# Patient Record
Sex: Female | Born: 1955 | Hispanic: No | Marital: Single | Smoking: Current every day smoker
Health system: Southern US, Community
[De-identification: ages and names within clinical notes are randomized; demographics above are authoritative.]

## PROBLEM LIST (undated history)

## (undated) ENCOUNTER — Emergency Department (HOSPITAL_COMMUNITY): Discharge status: Absent without leave | Payer: Medicaid Other

## (undated) DIAGNOSIS — F32A Depression, unspecified: Secondary | ICD-10-CM

## (undated) DIAGNOSIS — F329 Major depressive disorder, single episode, unspecified: Secondary | ICD-10-CM

## (undated) HISTORY — PX: TUBAL LIGATION: SHX77

## (undated) HISTORY — PX: ABDOMINAL HYSTERECTOMY: SHX81

---

## 2014-02-20 ENCOUNTER — Ambulatory Visit: Payer: Self-pay | Admitting: Physician Assistant

## 2014-09-30 ENCOUNTER — Emergency Department
Admit: 2014-09-30 | Disposition: A | Discharge status: Home / Self Care | Payer: Self-pay | Admitting: Emergency Medicine

## 2014-09-30 LAB — BASIC METABOLIC PANEL
Anion Gap: 9 (ref 7–16)
BUN: 29 mg/dL — ABNORMAL HIGH
Calcium, Total: 9.6 mg/dL
Chloride: 108 mmol/L
Co2: 23 mmol/L
Creatinine: 0.74 mg/dL
GLUCOSE: 107 mg/dL — AB
Potassium: 4.1 mmol/L
Sodium: 140 mmol/L

## 2014-09-30 LAB — CBC WITH DIFFERENTIAL/PLATELET
BASOS ABS: 0.1 10*3/uL (ref 0.0–0.1)
Basophil %: 1.1 %
EOS ABS: 0.3 10*3/uL (ref 0.0–0.7)
Eosinophil %: 3.2 %
HCT: 46.3 % (ref 35.0–47.0)
HGB: 15.8 g/dL (ref 12.0–16.0)
Lymphocyte #: 2.8 10*3/uL (ref 1.0–3.6)
Lymphocyte %: 30.7 %
MCH: 30.2 pg (ref 26.0–34.0)
MCHC: 34 g/dL (ref 32.0–36.0)
MCV: 89 fL (ref 80–100)
Monocyte #: 0.5 x10 3/mm (ref 0.2–0.9)
Monocyte %: 5.3 %
NEUTROS ABS: 5.4 10*3/uL (ref 1.4–6.5)
Neutrophil %: 59.7 %
Platelet: 317 10*3/uL (ref 150–440)
RBC: 5.21 10*6/uL — ABNORMAL HIGH (ref 3.80–5.20)
RDW: 13 % (ref 11.5–14.5)
WBC: 9 10*3/uL (ref 3.6–11.0)

## 2014-09-30 LAB — URINALYSIS, COMPLETE
Bilirubin,UR: NEGATIVE
Blood: NEGATIVE
Glucose,UR: NEGATIVE mg/dL (ref 0–75)
KETONE: NEGATIVE
Leukocyte Esterase: NEGATIVE
Nitrite: NEGATIVE
PH: 5 (ref 4.5–8.0)
PROTEIN: NEGATIVE
Specific Gravity: 1.01 (ref 1.003–1.030)

## 2014-09-30 LAB — TROPONIN I

## 2014-09-30 LAB — SALICYLATE LEVEL: Salicylates, Serum: <4 mg/dL

## 2014-09-30 LAB — CK: CK, Total: 153 U/L

## 2014-12-19 ENCOUNTER — Emergency Department: Payer: Medicaid Other

## 2014-12-19 ENCOUNTER — Encounter: Payer: Self-pay | Admitting: Emergency Medicine

## 2014-12-19 ENCOUNTER — Emergency Department
Admission: EM | Admit: 2014-12-19 | Discharge: 2014-12-19 | Disposition: A | Discharge status: Home / Self Care | Payer: Medicaid Other | Attending: Student | Admitting: Student

## 2014-12-19 DIAGNOSIS — Z720 Tobacco use: Secondary | ICD-10-CM | POA: Diagnosis not present

## 2014-12-19 DIAGNOSIS — R51 Headache: Secondary | ICD-10-CM | POA: Insufficient documentation

## 2014-12-19 DIAGNOSIS — G8929 Other chronic pain: Secondary | ICD-10-CM | POA: Diagnosis not present

## 2014-12-19 LAB — BASIC METABOLIC PANEL
ANION GAP: 10 (ref 5–15)
BUN: 23 mg/dL — ABNORMAL HIGH (ref 6–20)
CHLORIDE: 106 mmol/L (ref 101–111)
CO2: 22 mmol/L (ref 22–32)
Calcium: 8.7 mg/dL — ABNORMAL LOW (ref 8.9–10.3)
Creatinine, Ser: 0.65 mg/dL (ref 0.44–1.00)
GFR calc non Af Amer: >60 mL/min (ref >60)
GLUCOSE: 123 mg/dL — AB (ref 65–99)
POTASSIUM: 3.7 mmol/L (ref 3.5–5.1)
Sodium: 138 mmol/L (ref 135–145)

## 2014-12-19 LAB — CBC WITH DIFFERENTIAL/PLATELET
Basophils Absolute: 0.1 10*3/uL (ref 0–0.1)
Basophils Relative: 1 %
Eosinophils Absolute: 0.2 10*3/uL (ref 0–0.7)
Eosinophils Relative: 2 %
HCT: 44.1 % (ref 35.0–47.0)
Hemoglobin: 14.9 g/dL (ref 12.0–16.0)
LYMPHS ABS: 2.5 10*3/uL (ref 1.0–3.6)
LYMPHS PCT: 27 %
MCH: 30 pg (ref 26.0–34.0)
MCHC: 33.8 g/dL (ref 32.0–36.0)
MCV: 88.8 fL (ref 80.0–100.0)
MONO ABS: 0.5 10*3/uL (ref 0.2–0.9)
Monocytes Relative: 6 %
NEUTROS ABS: 5.9 10*3/uL (ref 1.4–6.5)
Neutrophils Relative %: 64 %
Platelets: 274 10*3/uL (ref 150–440)
RBC: 4.97 MIL/uL (ref 3.80–5.20)
RDW: 12.9 % (ref 11.5–14.5)
WBC: 9.3 10*3/uL (ref 3.6–11.0)

## 2014-12-19 MED ORDER — GADOBENATE DIMEGLUMINE 529 MG/ML IV SOLN
20.0000 mL | Freq: Once | INTRAVENOUS | Status: AC | PRN
  Administered 2014-12-19: 19 mL via INTRAVENOUS

## 2014-12-19 MED ORDER — KETOROLAC TROMETHAMINE 30 MG/ML IJ SOLN
INTRAMUSCULAR | Status: AC
  Administered 2014-12-19: 30 mg via INTRAVENOUS
  Filled 2014-12-19: qty 1

## 2014-12-19 MED ORDER — KETOROLAC TROMETHAMINE 30 MG/ML IJ SOLN
30.0000 mg | Freq: Once | INTRAMUSCULAR | Status: AC
  Administered 2014-12-19: 30 mg via INTRAVENOUS

## 2014-12-19 MED ORDER — DIPHENHYDRAMINE HCL 50 MG/ML IJ SOLN
INTRAMUSCULAR | Status: AC
  Administered 2014-12-19: 12.5 mg via INTRAVENOUS
  Filled 2014-12-19: qty 1

## 2014-12-19 MED ORDER — DIPHENHYDRAMINE HCL 50 MG/ML IJ SOLN
12.5000 mg | Freq: Once | INTRAMUSCULAR | Status: AC
  Administered 2014-12-19: 12.5 mg via INTRAVENOUS

## 2014-12-19 MED ORDER — SODIUM CHLORIDE 0.9 % IV BOLUS (SEPSIS)
1000.0000 mL | Freq: Once | INTRAVENOUS | Status: AC
  Administered 2014-12-19: 1000 mL via INTRAVENOUS

## 2014-12-19 MED ORDER — METOCLOPRAMIDE HCL 5 MG/ML IJ SOLN
INTRAMUSCULAR | Status: AC
Start: 2014-12-19 — End: 2014-12-19
  Administered 2014-12-19: 10 mg via INTRAVENOUS
  Filled 2014-12-19: qty 2

## 2014-12-19 MED ORDER — METOCLOPRAMIDE HCL 5 MG/ML IJ SOLN
10.0000 mg | Freq: Once | INTRAMUSCULAR | Status: AC
  Administered 2014-12-19: 10 mg via INTRAVENOUS

## 2014-12-19 NOTE — ED Notes (Signed)
Visual acuity 20/30 both eyes

## 2014-12-19 NOTE — ED Notes (Signed)
Patient to MRI.

## 2014-12-19 NOTE — ED Provider Notes (Signed)
[facility] Emergency Department Provider Note  ____________________________________________  Time seen: Approximately 2:09 PM  I have reviewed the triage vital signs and the nursing notes.   HISTORY  Chief Complaint Headache    HPI Manaia Samad is a 59 y.o. female with no chronic medical problems, history of hysterectomy, presents for evaluation of 3 months constant daily pressure-like headache, gradual onset, worse today. She has been seen by her primary care doctor, has had negative CT head however the cause of her headache remains unclear. She reports that intermittently she has "blurred vision". She has no blurred vision currently. She denies any sudden loss of vision. She describes the pain as pressure-like throughout the head and behind the eyes. No photophobia or phonophobia, no fevers, no neck pain or neck stiffness, no nausea or vomiting. No fevers. Currently her pain is moderate to severe. Modifying factors.   History reviewed. No pertinent past medical history.  There are no active problems to display for this patient.   Past Surgical History  Procedure Laterality Date  . Abdominal hysterectomy      No current outpatient prescriptions on file.  Allergies Review of patient's allergies indicates no known allergies.  History reviewed. No pertinent family history.  Social History History  Substance Use Topics  . Smoking status: Current Every Day Smoker -- 1.00 packs/day    Types: Cigarettes  . Smokeless tobacco: Not on file  . Alcohol Use: No    Review of Systems Constitutional: No fever/chills Eyes: No visual changes. ENT: No sore throat. Cardiovascular: Denies chest pain. Respiratory: Denies shortness of breath. Gastrointestinal: No abdominal pain.  No nausea, no vomiting.  No diarrhea.  No constipation. Genitourinary: Negative for dysuria. Musculoskeletal: Negative for back pain. Skin: Negative for rash. Neurological:  Positive for headaches,  No focal weakness or numbness.  10-point ROS otherwise negative.  ____________________________________________   PHYSICAL EXAM:  VITAL SIGNS: ED Triage Vitals  Enc Vitals Group     BP 12/19/14 1339 137/89 mmHg     Pulse Rate 12/19/14 1339 108     Resp --      Temp 12/19/14 1339 98.2 F (36.8 C)     Temp Source 12/19/14 1339 Oral     SpO2 12/19/14 1339 96 %     Weight 12/19/14 1339 200 lb (90.719 kg)     Height 12/19/14 1339  (1.6 m)     Head Cir --      Peak Flow --      Pain Score 12/19/14 1340 10     Pain Loc --      Pain Edu? --      Excl. in GC? --     Constitutional: Alert and oriented. Well appearing and in no acute distress. Eyes: Conjunctivae are normal. PERRL. EOMI. Head: Atraumatic. Nose: No congestion/rhinnorhea. Mouth/Throat: Mucous membranes are moist.  Oropharynx non-erythematous. Neck: No stridor. Neck supple without meningismus. Cardiovascular: Normal rate, regular rhythm. Grossly normal heart sounds.  Good peripheral circulation. Respiratory: Normal respiratory effort.  No retractions. Lungs CTAB. Gastrointestinal: Soft and nontender. No distention. No abdominal bruits. No CVA tenderness. Genitourinary: deferred Musculoskeletal: No lower extremity tenderness nor edema.  No joint effusions. Neurologic:  Normal speech and language. No gross focal neurologic deficits are appreciated. Speech is normal. No gait instability. 5 out of 5 strength in bilateral upper and lower extremities, sensation intact to light touch throughout, cranial nerves II through XII intact, normal finger-nose-finger, normal heel to shin. Skin:  Skin is  warm, dry. Large tick attached to upper midback. Psychiatric: Mood and affect are normal. Speech and behavior are normal.  ____________________________________________   LABS (all labs ordered are listed, but only abnormal results are displayed)  Labs Reviewed  BASIC METABOLIC PANEL - Abnormal; Notable  for the following:    Glucose, Bld 123 (*)    BUN 23 (*)    Calcium 8.7 (*)    All other components within normal limits  CBC WITH DIFFERENTIAL/PLATELET   ____________________________________________  EKG  none ____________________________________________  RADIOLOGY  MRI/MRA brain MRI HEAD FINDINGS  No evidence for acute infarction, hemorrhage, mass lesion, hydrocephalus, or extra-axial fluid. Normal cerebral volume. There are few minor foci of white matter disease, nonspecific, and not unexpected for age. No large vessel or lacunar infarct.  Marked empty sella. There is expansion of the sella with marked flattening of the gland against the floor. Empty sella can be associated with a wide variety of neurologic complaints, which include headache and visual difficulty. Correlate clinically.  Flow voids are maintained throughout the carotid, basilar, and vertebral arteries. There are no areas of chronic hemorrhage. No pineal or cerebellar tonsil abnormality.  Post infusion, no abnormal enhancement of the brain or meninges.  Visualized calvarium, skull base, and upper cervical osseous structures unremarkable. Scalp and extracranial soft tissues, orbits, sinuses, and mastoids show no acute process.  MRA HEAD FINDINGS  The internal carotid arteries demonstrate no proximal stenosis or occlusion. There is no evidence for dissection.  The basilar artery is widely patent. Both vertebrals contribute equally.  No proximal stenosis of the anterior, middle, or posterior cerebral arteries. No intracranial aneurysm. No cerebellar branch occlusion.  Mild irregularity of the distal MCA and PCA branches could suggest intracranial atherosclerotic disease.  IMPRESSION: Marked empty sella. The pituitary is flattened against the floor of an enlarged sella turcica. This can be associated with a wide variety of neurologic complaints, including headache and visual difficulty.  No  acute intracranial findings, or intracranial mass lesion.  No abnormal postcontrast enhancement.  No proximal intracranial stenosis or aneurysm.    ____________________________________________   PROCEDURES  Procedure(s) performed: None  Critical Care performed: No  ____________________________________________   INITIAL IMPRESSION / ASSESSMENT AND PLAN / ED COURSE  Pertinent labs & imaging results that were available during my care of the patient were reviewed by me and considered in my medical decision making (see chart for details).  Florina Schweers Cockcroft is a 59 y.o. female with no chronic medical problems, history of hysterectomy, presents for evaluation of 3 months constant daily pressure-like headache, gradual onset. On exam, she is generally well-appearing and in no acute distress. Mild tachycardia on arrival has resolved without any intervention in the emergency department. She is an intact neurological examination. Neck is supple without meningismus. Doubt subarachnoid hemorrhage or meningitis however given daily pressure-like headache, we will obtain MRI of the brain to evaluate for possible mass/aneurysm. Symptoms are not consistent with CVA or TIAs. We'll treat with migraine cocktail.  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 Discussed with Dr.Nundkumar of [facility] reports patient does not require any acute neurosurgical intervention, she can be referred to neurology as an outpatient. Headache improved somewhat with IV and cocktail. Discussed return precautions, close PCP follow-up, neurology follow-up. Patient is comfortable with the discharge plan. ____________________________________________   FINAL CLINICAL IMPRESSION(S) / ED DIAGNOSES  Final diagnoses:  Chronic nonintractable headache, unspecified headache type      Bobetta Lime A  Inocencio Homes,  MD 12/19/14 [phone]

## 2014-12-19 NOTE — ED Notes (Signed)
MRI paged and made aware of patient. States she will be here in about 20 minutes. Will need someone to transport patient and to sit with tech during exam.

## 2014-12-19 NOTE — ED Notes (Signed)
Pt complains of pressure in head and has been seeing her PCP for symptoms for last 3 months. Pt states she woke up today and the pressure is worse, has trouble seeing and doing her normal ADL's. Pt states that medications that were prescribed to her are not helping.

## 2014-12-19 NOTE — ED Notes (Signed)
Returned with pt from MRI.

## 2014-12-19 NOTE — ED Notes (Signed)
Patient seen here in April for same. Has primary MD. Was told to see neurology and cannot get in until Sept 2016. Patient states she is concerned because she is not getting any better and is scared to drive.

## 2015-01-03 ENCOUNTER — Ambulatory Visit
Admission: EM | Admit: 2015-01-03 | Discharge: 2015-01-03 | Disposition: A | Discharge status: Home / Self Care | Payer: Medicaid Other | Attending: Internal Medicine | Admitting: Internal Medicine

## 2015-01-03 ENCOUNTER — Encounter: Payer: Self-pay | Admitting: Emergency Medicine

## 2015-01-03 DIAGNOSIS — L240 Irritant contact dermatitis due to detergents: Secondary | ICD-10-CM | POA: Diagnosis not present

## 2015-01-03 DIAGNOSIS — L0100 Impetigo, unspecified: Secondary | ICD-10-CM | POA: Diagnosis not present

## 2015-01-03 DIAGNOSIS — L258 Unspecified contact dermatitis due to other agents: Secondary | ICD-10-CM

## 2015-01-03 HISTORY — DX: Major depressive disorder, single episode, unspecified: F32.9

## 2015-01-03 HISTORY — DX: Depression, unspecified: F32.A

## 2015-01-03 MED ORDER — PREDNISONE 20 MG PO TABS: 20.0000 mg | ORAL_TABLET | Freq: Every day | ORAL | Status: AC

## 2015-01-03 MED ORDER — MUPIROCIN 2 % EX OINT: 1.0000 "application " | TOPICAL_OINTMENT | Freq: Two times a day (BID) | CUTANEOUS | Status: AC

## 2015-01-03 NOTE — ED Notes (Signed)
Rash on face , arm and down neck for 2 days

## 2015-01-03 NOTE — Discharge Instructions (Signed)
Prescriptions for prednisone (steroid) and mupirocin ointment (antibiotic) were sent to the pharmacy. Recheck if not starting to improve in 2-3 days.  Contact Dermatitis Contact dermatitis is a rash that happens when something touches the skin. You touched something that irritates your skin, or you have allergies to something you touched. HOME CARE   Avoid the thing that caused your rash.  Keep your rash away from hot water, soap, sunlight, chemicals, and other things that might bother it.  Do not scratch your rash.  You can take cool baths to help stop itching.  Only take medicine as told by your doctor.  Keep all doctor visits as told. GET HELP RIGHT AWAY IF:   Your rash is not better after 3 days.  Your rash gets worse.  Your rash is puffy (swollen), tender, red, sore, or warm.  You have problems with your medicine. MAKE SURE YOU:   Understand these instructions.  Will watch your condition.  Will get help right away if you are not doing well or get worse. Document Released: 04/08/2009 Document Revised: 09/03/2011 Document Reviewed: 11/14/2010 [facility] Patient Information 2015 [place], [place]. This information is not intended to replace advice given to you by your health care provider. Make sure you discuss any questions you have with your health care provider.

## 2015-01-03 NOTE — ED Provider Notes (Signed)
CSN: [id number]     Arrival date & time 01/03/15  1745 History   First MD Initiated Contact with Patient 01/03/15 1912     Chief Complaint  Patient presents with  . Rash   HPI  Patient is a 59 year old lady washed a couple of dogs and some harsh flea soap 2 days ago. Subsequently she has had the development of itchy blistery red patches over her left face and neck, with some on her left forearm as well. She is miserable itching. A few of the patches are sore from scratching. No fever, no malaise.   Past Medical History  Diagnosis Date  . Depression    Past Surgical History  Procedure Laterality Date  . Abdominal hysterectomy    . Tubal ligation     No family history on file. History  Substance Use Topics  . Smoking status: Current Every Day Smoker -- 1.00 packs/day    Types: Cigarettes  . Smokeless tobacco: Never Used  . Alcohol Use: No    Review of Systems  All other systems reviewed and are negative.   Allergies  Review of patient's allergies indicates no known allergies.  Home Medications   Prior to Admission medications   Medication Sig Start Date End Date Taking? Authorizing Provider  escitalopram (LEXAPRO) 10 MG tablet Take 10 mg by mouth daily.   Yes Historical Provider, MD                 BP 120/61 mmHg  Pulse 91  Temp(Src) 98.8 F (37.1 C) (Oral)  Resp 16  Ht 5\' 3"  (1.6 m)  Wt 200 lb (90.719 kg)  BMI 35.44 kg/m2  SpO2 98% Physical Exam  Constitutional: She is oriented to person, place, and time. No distress.  Alert, nicely groomed  HENT:  Head: Atraumatic.  Eyes:  Conjugate gaze, no eye redness/drainage  Neck: Neck supple.  Cardiovascular: Normal rate.   Pulmonary/Chest: No respiratory distress.  Abdominal: She exhibits no distension.  Musculoskeletal: Normal range of motion.  No leg swelling  Neurological: She is alert and oriented to person, place, and time.  Skin: Skin is warm and dry.  No cyanosis Irregular red patches over most of the  left face and neck, extending into the hairline. Many of the patches are papular, and most have some vesicles. There are some similar, smaller patches on the left ventral forearm A few patches have some golden crusting.  Nursing note and vitals reviewed.   ED Course  Procedures   MDM   1. Contact dermatitis due to soap   2. Impetigo    Prescriptions for prednisone and Bactroban ointment were sent to the pharmacy. The patient should recheck if redness and itching are not starting to improve in 2-3 days.    Eustace MooreLaura W Ehab Humber, MD 01/03/15 [phone]

## 2017-06-24 IMAGING — MR MR MRA HEAD W/O CM
1 series · 22 of 48 positions shown · IV contrast (multihance)
Comparison: None.

CLINICAL DATA: Daily headaches, which are worse than usual today,
along with visual difficulty today. Initial encounter.

EXAM:
MRI HEAD WITHOUT AND WITH CONTRAST
MRA HEAD WITHOUT CONTRAST
TECHNIQUE: Multiplanar, multiecho pulse sequences of the brain and surrounding
structures were obtained without and with intravenous contrast.
Angiographic images of the head were obtained using MRA technique
without contrast.
CONTRAST:  19mL MULTIHANCE GADOBENATE DIMEGLUMINE 529 MG/ML IV SOLN

[Series 2: TOF · axial · non-contrast · 0.7mm · 0.37mm/px · z∈[-67,+62]mm · 22 of 195 slices shown]
[im 1/195]
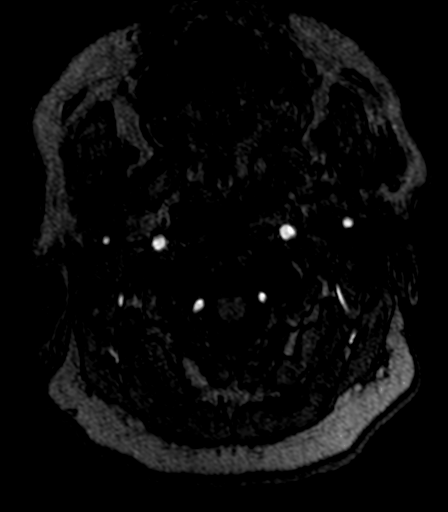
[im 5/195]
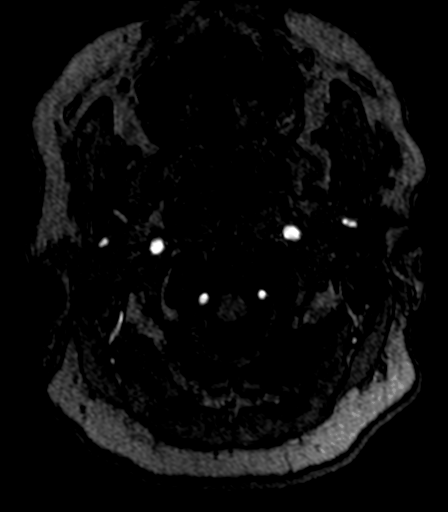
[im 9/195]
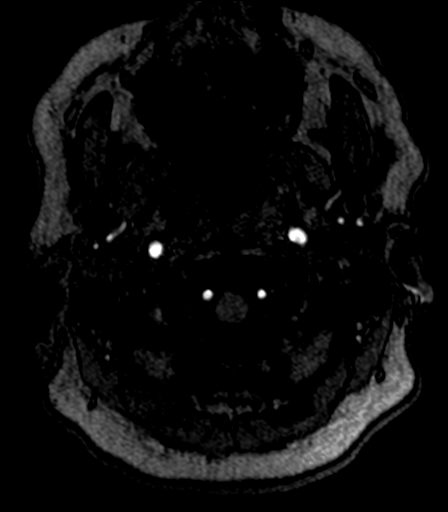
[im 13/195]
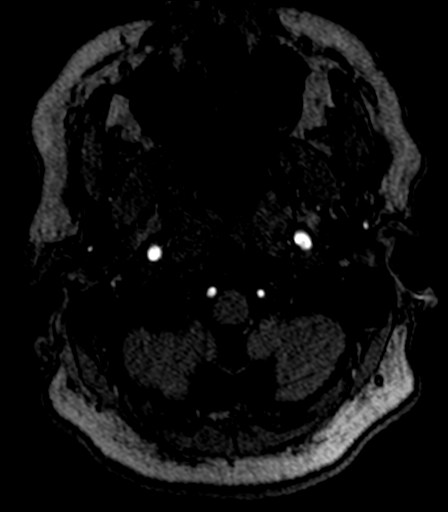
[im 17/195]
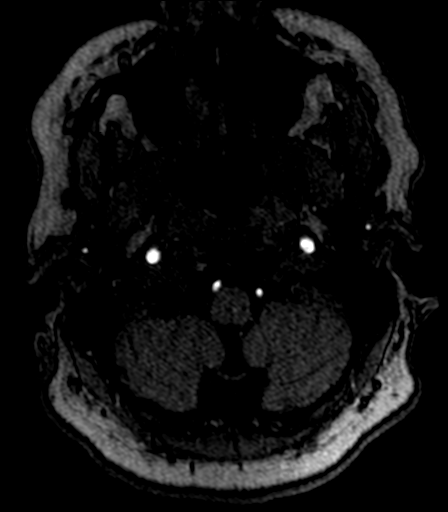
[im 21/195]
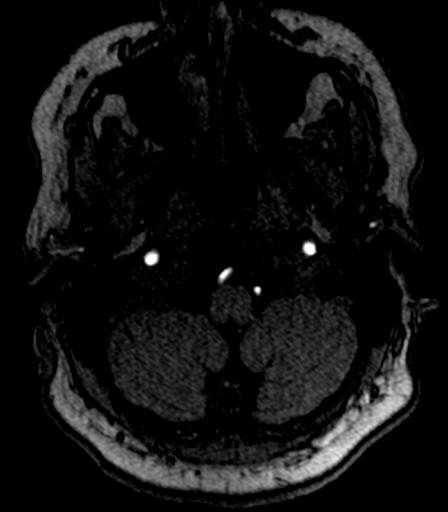
[im 25/195]
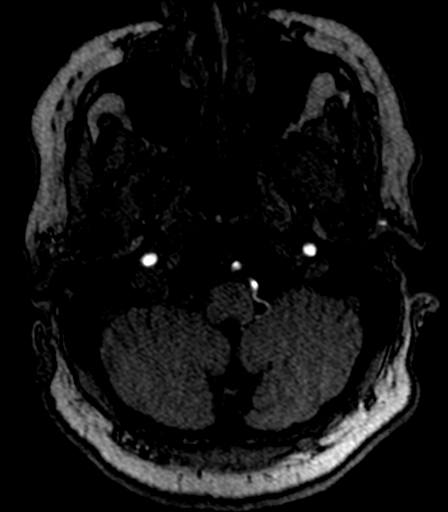
[im 29/195]
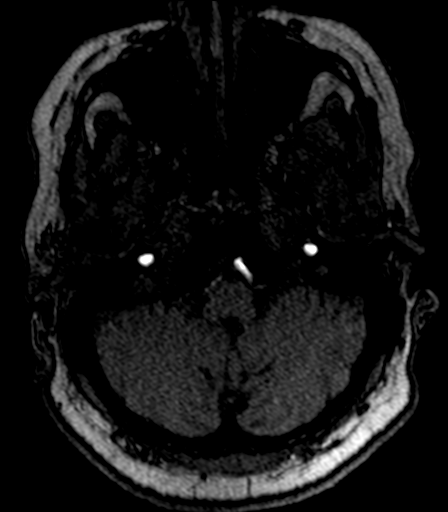
[im 34/195]
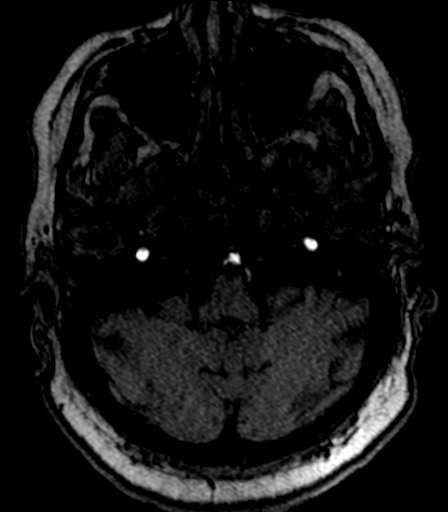
[im 38/195]
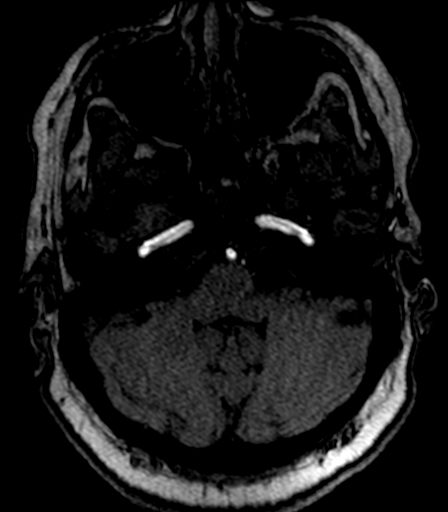
[im 42/195]
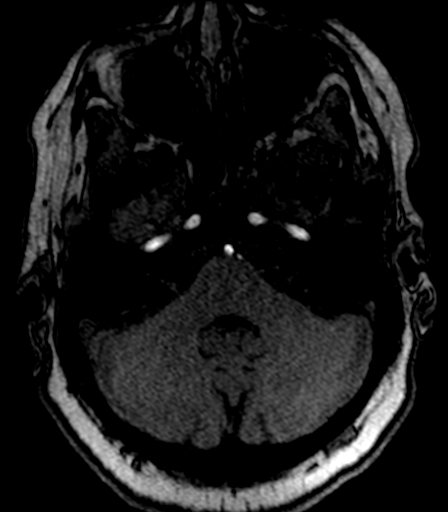
[im 46/195]
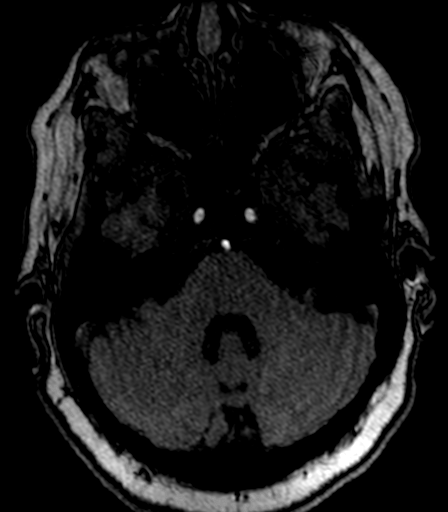
[im 50/195]
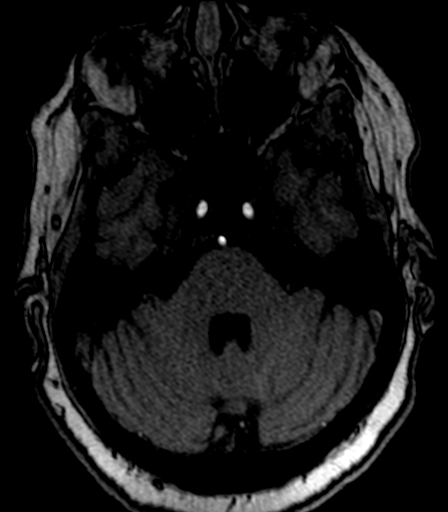
[im 54/195]
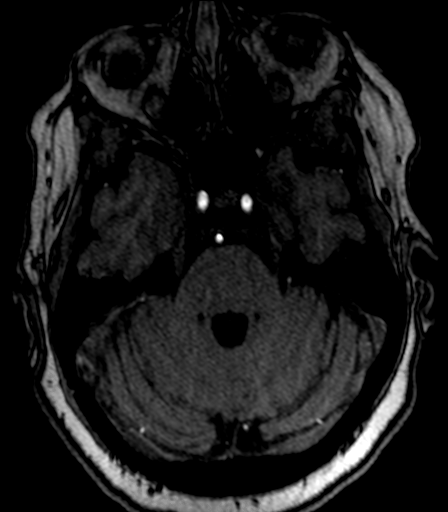
[im 62/195]
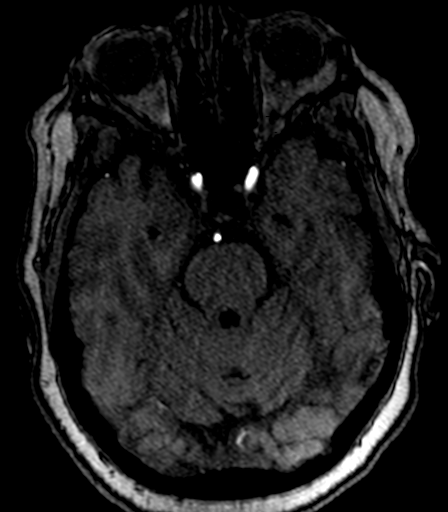
[im 87/195]
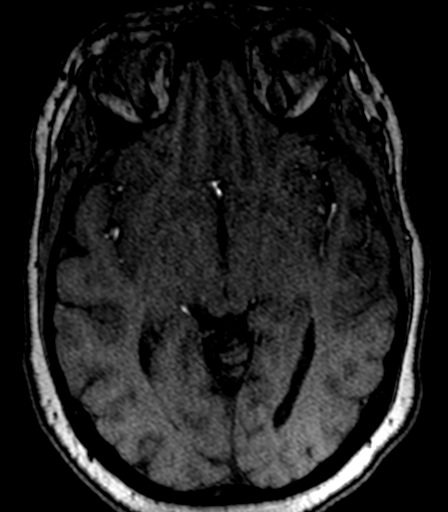
[im 100/195]
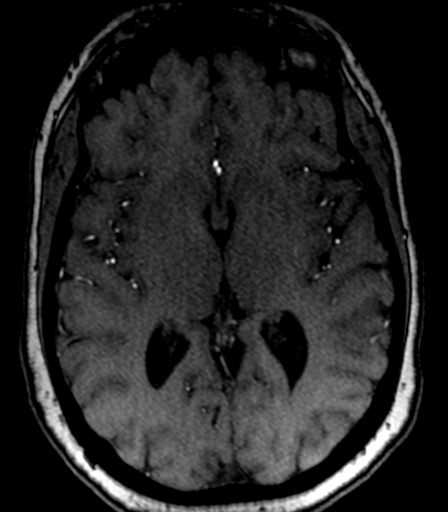
[im 112/195]
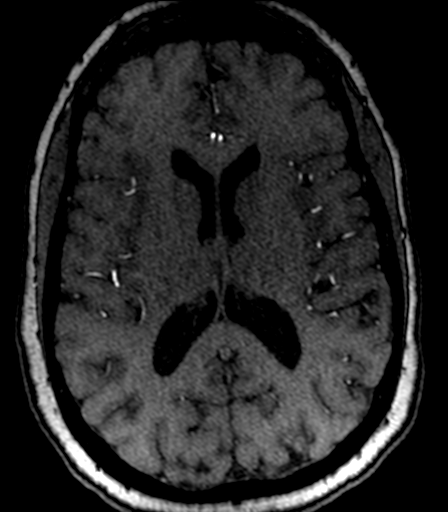
[im 137/195]
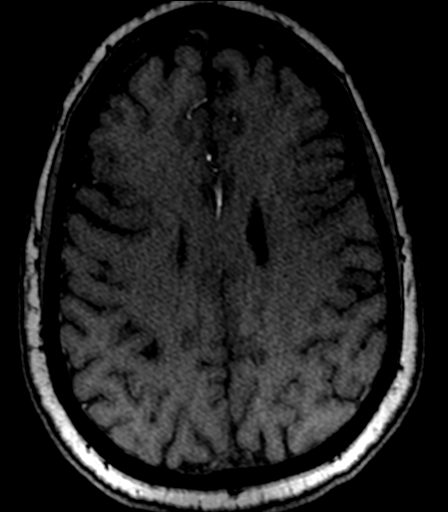
[im 161/195]
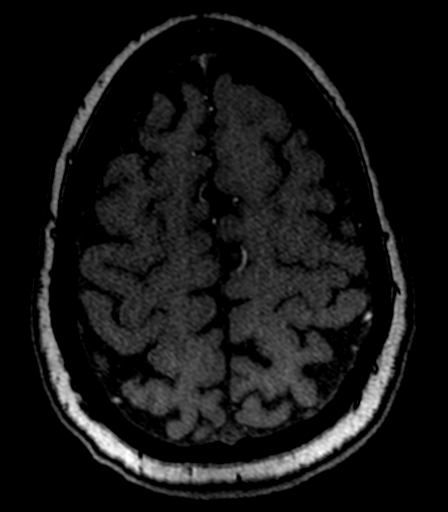
[im 166/195]
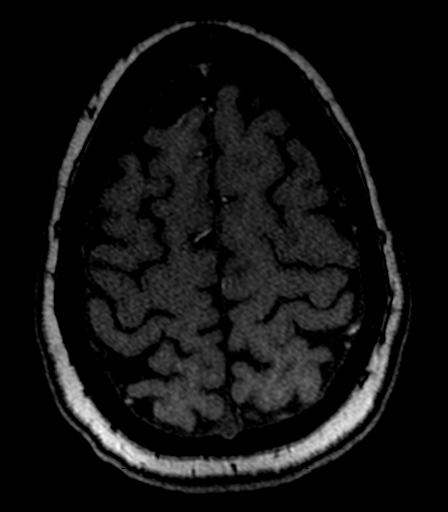
[im 186/195]
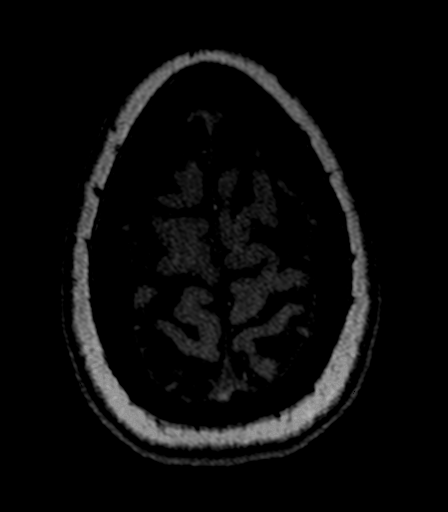

[22 of 48 positions shown; findings below may reference images not displayed]

FINDINGS: MRI HEAD FINDINGS

No evidence for acute infarction, hemorrhage, mass lesion,
hydrocephalus, or extra-axial fluid. Normal cerebral volume. There
are few minor foci of white matter disease, nonspecific, and not
unexpected for age. No large vessel or lacunar infarct.

Marked empty sella. There is expansion of the sella with marked
flattening of the gland against the floor. Empty sella can be
associated with a wide variety of neurologic complaints, which
include headache and visual difficulty. Correlate clinically.

Flow voids are maintained throughout the carotid, basilar, and
vertebral arteries. There are no areas of chronic hemorrhage. No
pineal or cerebellar tonsil abnormality.

Post infusion, no abnormal enhancement of the brain or meninges.

Visualized calvarium, skull base, and upper cervical osseous
structures unremarkable. Scalp and extracranial soft tissues,
orbits, sinuses, and mastoids show no acute process.

MRA HEAD FINDINGS

The internal carotid arteries demonstrate no proximal stenosis or
occlusion. There is no evidence for dissection.

The basilar artery is widely patent. Both vertebrals contribute
equally.

No proximal stenosis of the anterior, middle, or posterior cerebral
arteries. No intracranial aneurysm. No cerebellar branch occlusion.

Mild irregularity of the distal MCA and PCA branches could suggest
intracranial atherosclerotic disease.
IMPRESSION: Marked empty sella. The pituitary is flattened against the floor of
an enlarged sella turcica. This can be associated with a wide
variety of neurologic complaints, including headache and visual
difficulty.

No acute intracranial findings, or intracranial mass lesion.

No abnormal postcontrast enhancement.

No proximal intracranial stenosis or aneurysm.
